# Patient Record
Sex: Female | Born: 1960 | Race: White | Hispanic: No | Marital: Married | Smoking: Never smoker
Health system: Southern US, Community
[De-identification: ages and names within clinical notes are randomized; demographics above are authoritative.]

## PROBLEM LIST (undated history)

## (undated) DIAGNOSIS — E063 Autoimmune thyroiditis: Secondary | ICD-10-CM

## (undated) DIAGNOSIS — D510 Vitamin B12 deficiency anemia due to intrinsic factor deficiency: Secondary | ICD-10-CM

## (undated) DIAGNOSIS — L719 Rosacea, unspecified: Secondary | ICD-10-CM

## (undated) DIAGNOSIS — N879 Dysplasia of cervix uteri, unspecified: Secondary | ICD-10-CM

## (undated) DIAGNOSIS — R87629 Unspecified abnormal cytological findings in specimens from vagina: Secondary | ICD-10-CM

## (undated) DIAGNOSIS — M4306 Spondylolysis, lumbar region: Secondary | ICD-10-CM

## (undated) DIAGNOSIS — N3010 Interstitial cystitis (chronic) without hematuria: Secondary | ICD-10-CM

## (undated) DIAGNOSIS — Q796 Ehlers-Danlos syndrome, unspecified: Secondary | ICD-10-CM

## (undated) HISTORY — DX: Dysplasia of cervix uteri, unspecified: N87.9

## (undated) HISTORY — PX: NASAL SINUS SURGERY: SHX719

## (undated) HISTORY — DX: Vitamin B12 deficiency anemia due to intrinsic factor deficiency: D51.0

## (undated) HISTORY — DX: Unspecified abnormal cytological findings in specimens from vagina: R87.629

## (undated) HISTORY — DX: Spondylolysis, lumbar region: M43.06

## (undated) HISTORY — DX: Interstitial cystitis (chronic) without hematuria: N30.10

## (undated) HISTORY — DX: Autoimmune thyroiditis: E06.3

## (undated) HISTORY — DX: Ehlers-Danlos syndrome, unspecified: Q79.60

## (undated) HISTORY — PX: OTHER SURGICAL HISTORY: SHX169

## (undated) HISTORY — DX: Rosacea, unspecified: L71.9

## (undated) HISTORY — PX: COLPOSCOPY: SHX161

## (undated) HISTORY — PX: CARDIAC CATHETERIZATION: SHX172

---

## 2008-03-20 HISTORY — PX: OTHER SURGICAL HISTORY: SHX169

## 2018-03-26 ENCOUNTER — Ambulatory Visit (INDEPENDENT_AMBULATORY_CARE_PROVIDER_SITE_OTHER): Payer: Self-pay | Admitting: Internal Medicine

## 2018-04-12 ENCOUNTER — Ambulatory Visit (INDEPENDENT_AMBULATORY_CARE_PROVIDER_SITE_OTHER): Payer: No Typology Code available for payment source | Admitting: Internal Medicine

## 2018-04-12 ENCOUNTER — Encounter (INDEPENDENT_AMBULATORY_CARE_PROVIDER_SITE_OTHER): Payer: Self-pay | Admitting: Internal Medicine

## 2018-04-12 VITALS — BP 110/74 | HR 73 | Temp 98.0°F | Ht 64.0 in | Wt 176.0 lb

## 2018-04-12 DIAGNOSIS — R195 Other fecal abnormalities: Secondary | ICD-10-CM | POA: Diagnosis not present

## 2018-04-12 NOTE — Progress Notes (Addendum)
   Subjective:    Patient ID: Anita Petersen, female    DOB: 10-16-1960, 58 y.o.   MRN: [id number]  HPI Referred by [facility] for positive FIT test. His last colonoscopy was 04/18/2013 per records by Dr. Aleene Davidson. Repeat in 5 years due to hx of polyps and Ehlers-Danlos Syndrome per records.  She has not seen any blood. No change in her stools.  Appetite is good. No weight loss. No abdominal pain.  No family hx of colon cancer.   Review of Systems       Past Medical History:  Diagnosis Date  . Cystitis, interstitial   . Ehlers-Danlos syndrome   . Hashimoto's thyroiditis   . Lumbar spondylolysis   . Pernicious anemia   . Pernicious anemia   . Rosacea     Past Surgical History:  Procedure Laterality Date  . bladder tack    . CARDIAC CATHETERIZATION    . cyst removed rt hand    . microvascular disease    . NASAL SINUS SURGERY    . pacemaker to bladder    . tonisillectomy      Allergies  Allergen Reactions  . Augmentin [Amoxicillin-Pot Clavulanate]     vomiting  . Codeine     Ill, vomiting.   . Vicodin [Hydrocodone-Acetaminophen]     Sweats, vomiting.    Current Outpatient Medications on File Prior to Visit  Medication Sig Dispense Refill  . Alpha-Lipoic Acid 600 MG CAPS Take by mouth.    . doxycycline (VIBRAMYCIN) 100 MG capsule Take 100 mg by mouth daily.     Marland Kitchen l-methylfolate-B6-B12 (METANX) 3-35-2 MG TABS tablet Take 1 tablet by mouth daily.    Marland Kitchen levothyroxine (SYNTHROID, LEVOTHROID) 125 MCG tablet Take 125 mcg by mouth daily before breakfast.    . pentosan polysulfate (ELMIRON) 100 MG capsule Take 200 mg by mouth 3 (three) times daily.     No current facility-administered medications on file prior to visit.         Objective:   Physical Exam Blood pressure 110/74, pulse 73, temperature 98 F (36.7 C), height 5\' 4"  (1.626 m), weight 176 lb (79.8 kg).  Alert and oriented. Skin warm and dry. Oral mucosa is moist.   . Sclera anicteric,  conjunctivae is pink. Thyroid not enlarged. No cervical lymphadenopathy. Lungs clear. Heart regular rate and rhythm.  Abdomen is soft. Bowel sounds are positive. No hepatomegaly. No abdominal masses felt. No tenderness.  No edema to lower extremities. Patient is alert and oriented.        Assessment & Plan:  Guaiac [facility]. Will get last colonoscopy from Dr. Vangie Bicker office. Further recommendations to follow.

## 2018-04-12 NOTE — Patient Instructions (Signed)
Will get records from Dr. Aleene Davidson.

## 2019-09-18 HISTORY — PX: GALLBLADDER SURGERY: SHX652

## 2019-12-04 ENCOUNTER — Other Ambulatory Visit (HOSPITAL_COMMUNITY): Payer: Self-pay | Admitting: Family Medicine

## 2019-12-04 DIAGNOSIS — Z1231 Encounter for screening mammogram for malignant neoplasm of breast: Secondary | ICD-10-CM

## 2019-12-19 ENCOUNTER — Other Ambulatory Visit: Payer: Self-pay

## 2019-12-19 ENCOUNTER — Ambulatory Visit (HOSPITAL_COMMUNITY): Payer: Non-veteran care

## 2019-12-19 ENCOUNTER — Ambulatory Visit (HOSPITAL_COMMUNITY)
Admission: RE | Admit: 2019-12-19 | Discharge: 2019-12-19 | Disposition: A | Discharge status: Home / Self Care | Payer: No Typology Code available for payment source | Source: Ambulatory Visit | Attending: Family Medicine | Admitting: Family Medicine

## 2019-12-19 DIAGNOSIS — Z1231 Encounter for screening mammogram for malignant neoplasm of breast: Secondary | ICD-10-CM

## 2020-12-20 ENCOUNTER — Other Ambulatory Visit (HOSPITAL_COMMUNITY): Payer: Self-pay | Admitting: Family Medicine

## 2020-12-20 ENCOUNTER — Other Ambulatory Visit (HOSPITAL_COMMUNITY): Payer: Self-pay | Admitting: *Deleted

## 2020-12-20 DIAGNOSIS — Z1231 Encounter for screening mammogram for malignant neoplasm of breast: Secondary | ICD-10-CM

## 2021-01-03 ENCOUNTER — Ambulatory Visit (HOSPITAL_COMMUNITY)
Admission: RE | Admit: 2021-01-03 | Discharge: 2021-01-03 | Disposition: A | Discharge status: Home / Self Care | Payer: No Typology Code available for payment source | Source: Ambulatory Visit | Attending: Family Medicine | Admitting: Family Medicine

## 2021-01-03 ENCOUNTER — Other Ambulatory Visit: Payer: Self-pay

## 2021-01-03 DIAGNOSIS — Z1231 Encounter for screening mammogram for malignant neoplasm of breast: Secondary | ICD-10-CM | POA: Diagnosis not present

## 2021-03-04 ENCOUNTER — Other Ambulatory Visit (HOSPITAL_COMMUNITY): Payer: Self-pay | Admitting: Family Medicine

## 2021-03-04 ENCOUNTER — Other Ambulatory Visit: Payer: Self-pay | Admitting: Family Medicine

## 2021-03-04 DIAGNOSIS — E039 Hypothyroidism, unspecified: Secondary | ICD-10-CM

## 2021-03-16 ENCOUNTER — Other Ambulatory Visit: Payer: Self-pay

## 2021-03-16 ENCOUNTER — Ambulatory Visit (HOSPITAL_COMMUNITY)
Admission: RE | Admit: 2021-03-16 | Discharge: 2021-03-16 | Disposition: A | Discharge status: Home / Self Care | Payer: No Typology Code available for payment source | Source: Ambulatory Visit | Attending: Family Medicine | Admitting: Family Medicine

## 2021-03-16 DIAGNOSIS — E039 Hypothyroidism, unspecified: Secondary | ICD-10-CM | POA: Insufficient documentation

## 2022-01-07 IMAGING — US US THYROID
1 series · 14 of 25 positions shown · non-contrast
Comparison: Reported comparison US Thyroid from [DATE], however
this record could not be found.

CLINICAL DATA: Hypothyroid.  Intermittent dysphagia.

EXAM:
THYROID ULTRASOUND
TECHNIQUE: Ultrasound examination of the thyroid gland and adjacent soft
tissues was performed.

[Series 1: us thyroid · 14 of 43 slices shown]
[im 1/43]
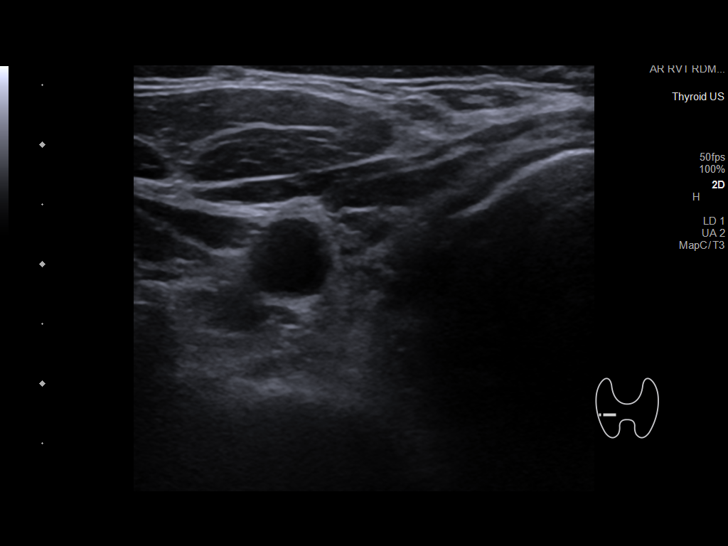
[im 4/43]
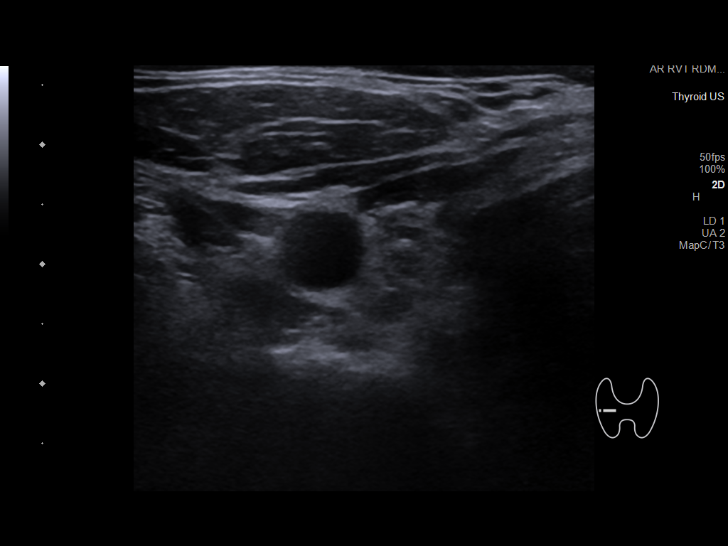
[im 8/43]
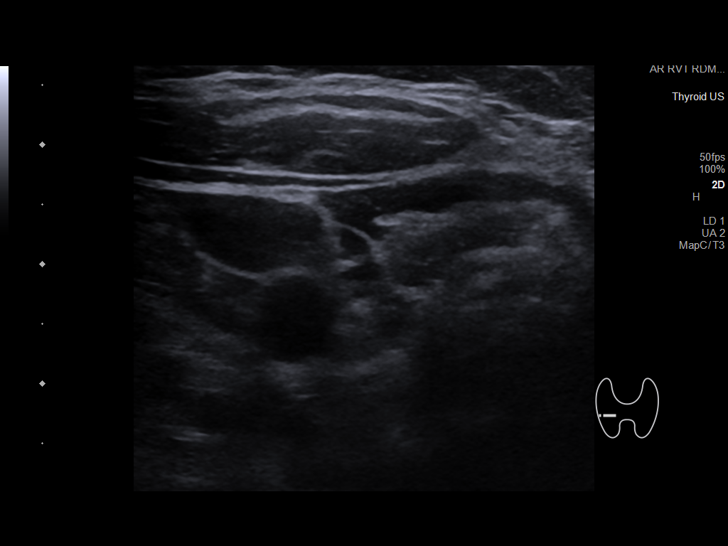
[im 11/43]
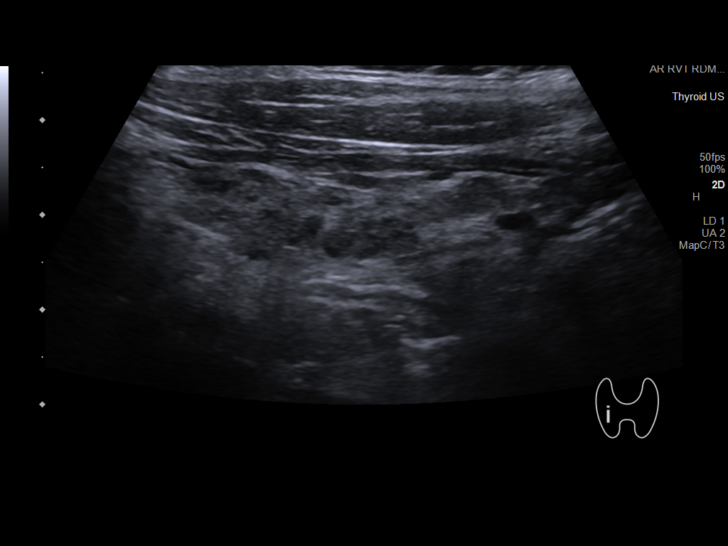
[im 15/43]
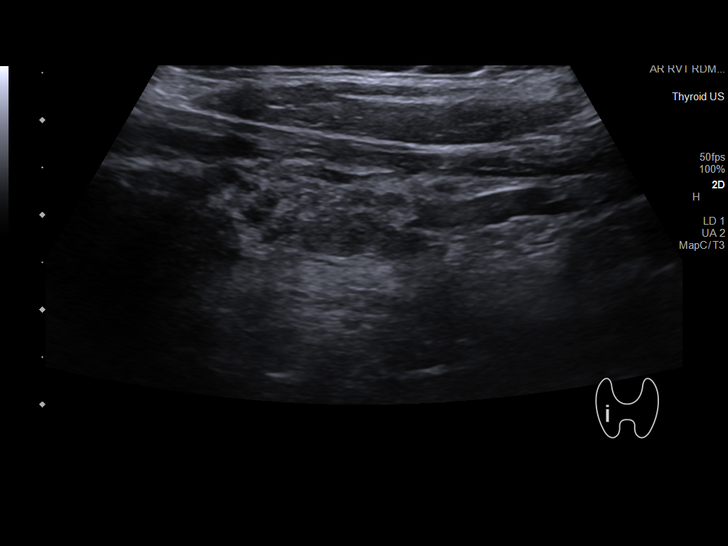
[im 16/43]
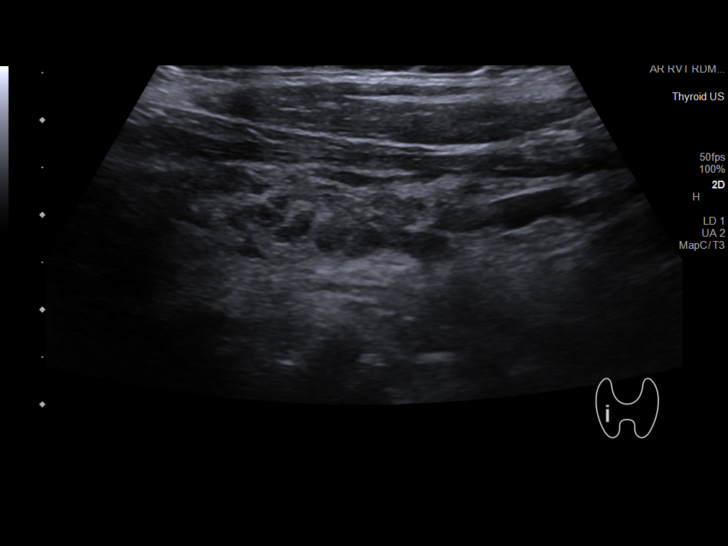
[im 20/43]
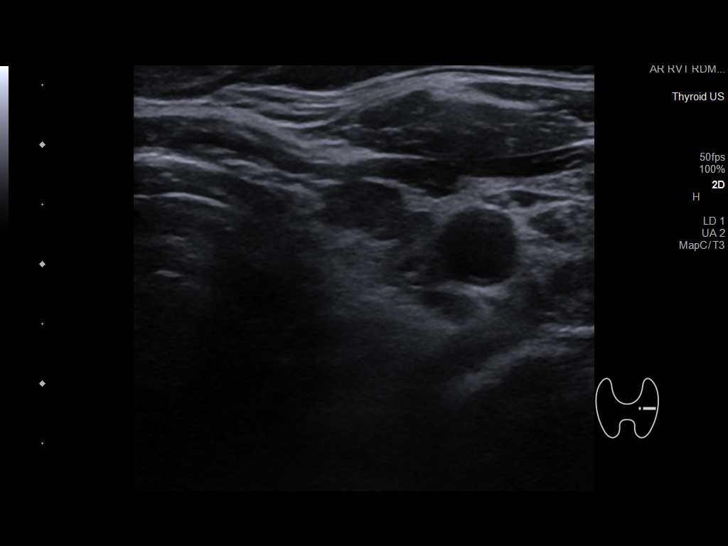
[im 23/43]
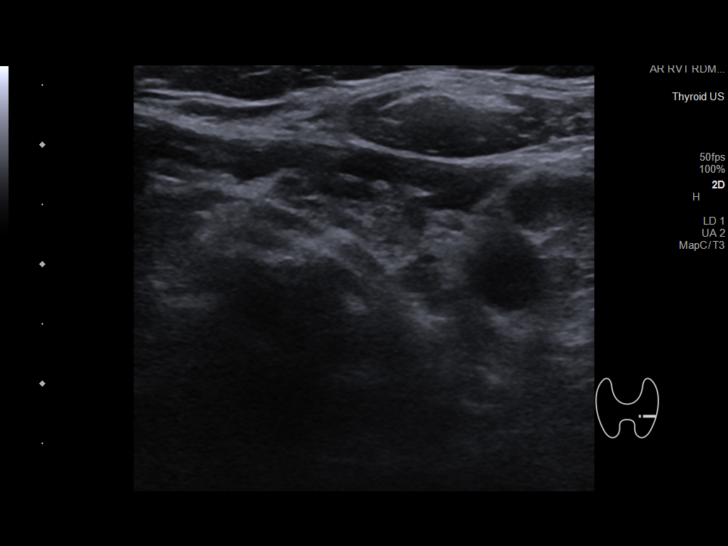
[im 27/43]
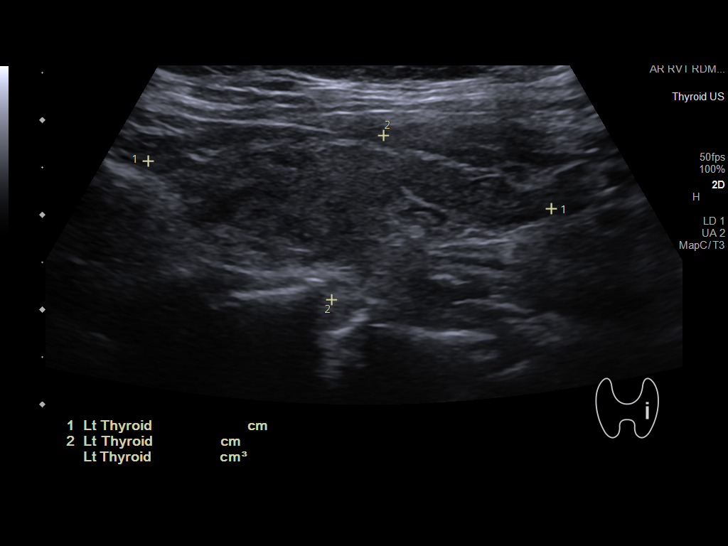
[im 29/43]
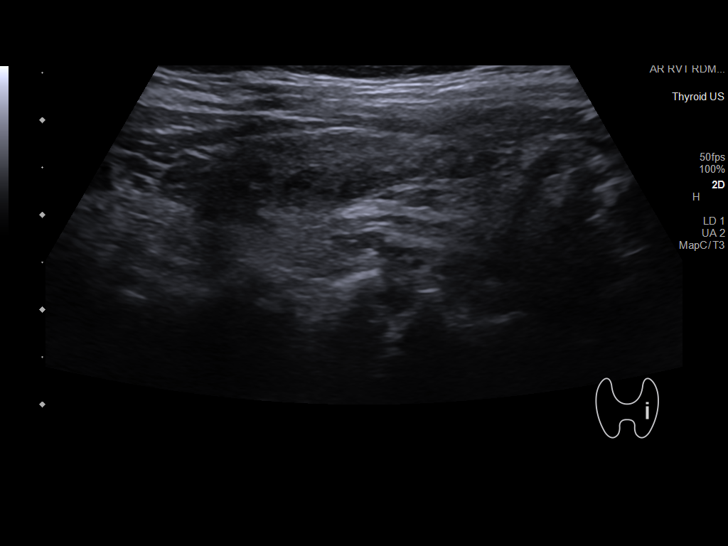
[im 32/43]
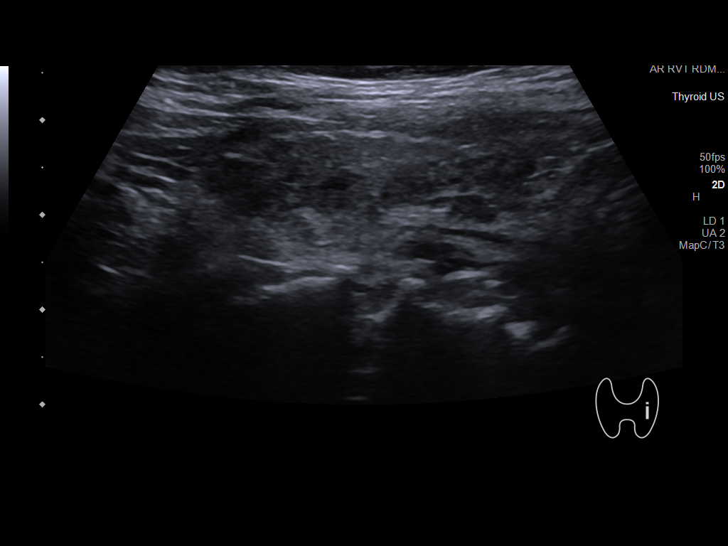
[im 36/43]
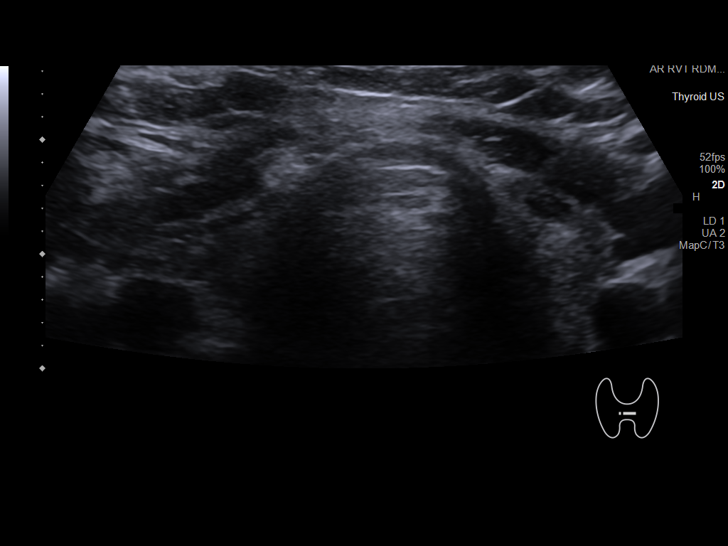
[im 39/43]
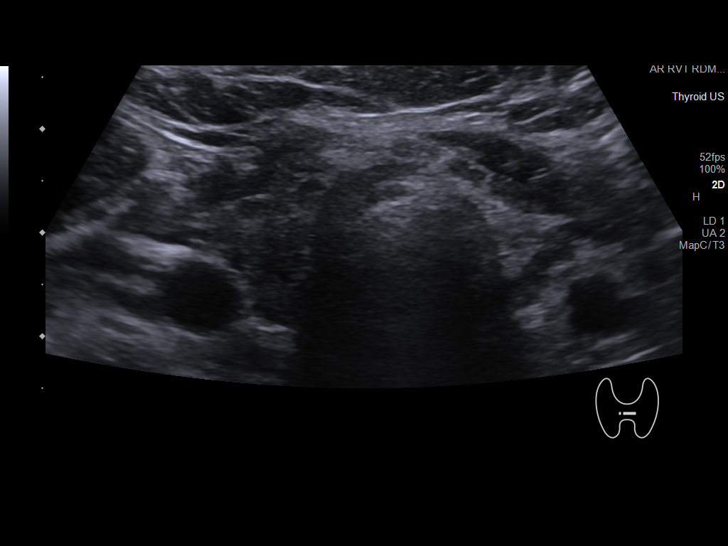
[im 43/43]
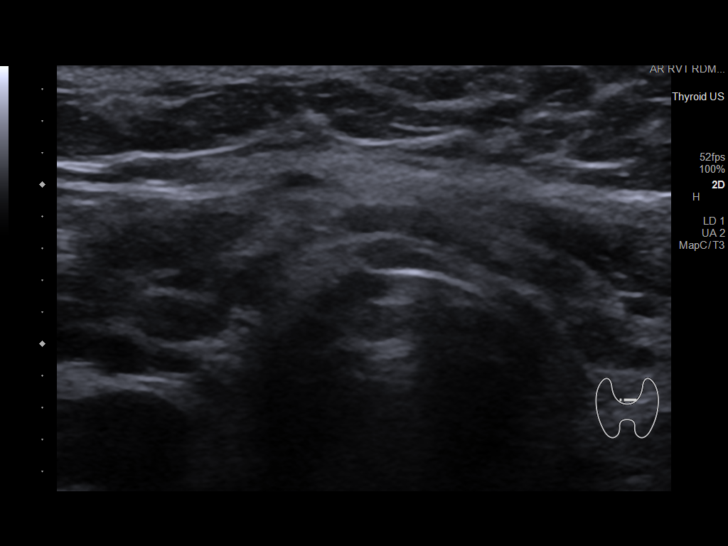

[14 of 25 positions shown; findings below may reference images not displayed]

FINDINGS: Parenchymal Echotexture: Moderately heterogenous

Isthmus: 0.2 cm

Right lobe: 3.9 x 1.5 x 1.0 cm

Left lobe: 4.3 x 1.8 x 0.8 cm

_________________________________________________________

Estimated total number of nodules >/= 1 cm: 0

Number of spongiform nodules >/=  2 cm not described below (TR1): 0

Number of mixed cystic and solid nodules >/= 1.5 cm not described
below (TR2): 0

_________________________________________________________

No discrete nodules are seen within the thyroid gland.

No cervical adenopathy or fluid collection within the imaged neck.
IMPRESSION: Atrophic / diminutive, heterogeneous thyroid gland without discrete
nodule.

## 2022-01-18 HISTORY — PX: OTHER SURGICAL HISTORY: SHX169

## 2022-02-17 HISTORY — PX: GANGLION CYST EXCISION: SHX1691

## 2022-03-16 ENCOUNTER — Encounter: Payer: Self-pay | Admitting: Internal Medicine

## 2022-03-22 ENCOUNTER — Other Ambulatory Visit (HOSPITAL_COMMUNITY): Payer: Self-pay | Admitting: Internal Medicine

## 2022-03-22 DIAGNOSIS — Z13820 Encounter for screening for osteoporosis: Secondary | ICD-10-CM

## 2022-03-22 DIAGNOSIS — Z1231 Encounter for screening mammogram for malignant neoplasm of breast: Secondary | ICD-10-CM

## 2022-03-30 ENCOUNTER — Other Ambulatory Visit: Payer: Non-veteran care

## 2022-03-30 ENCOUNTER — Ambulatory Visit (HOSPITAL_COMMUNITY)
Admission: RE | Admit: 2022-03-30 | Discharge: 2022-03-30 | Disposition: A | Discharge status: Home / Self Care | Payer: No Typology Code available for payment source | Source: Ambulatory Visit | Attending: Internal Medicine | Admitting: Internal Medicine

## 2022-03-30 DIAGNOSIS — Z13820 Encounter for screening for osteoporosis: Secondary | ICD-10-CM | POA: Insufficient documentation

## 2022-03-30 DIAGNOSIS — Z1231 Encounter for screening mammogram for malignant neoplasm of breast: Secondary | ICD-10-CM | POA: Diagnosis present

## 2022-05-19 HISTORY — PX: OTHER SURGICAL HISTORY: SHX169

## 2022-07-12 ENCOUNTER — Other Ambulatory Visit (HOSPITAL_COMMUNITY): Payer: Self-pay | Admitting: Nurse Practitioner

## 2022-07-12 DIAGNOSIS — Z1231 Encounter for screening mammogram for malignant neoplasm of breast: Secondary | ICD-10-CM

## 2022-10-04 ENCOUNTER — Other Ambulatory Visit (HOSPITAL_COMMUNITY): Payer: Self-pay | Admitting: Nurse Practitioner

## 2022-10-04 DIAGNOSIS — R7989 Other specified abnormal findings of blood chemistry: Secondary | ICD-10-CM

## 2022-10-17 ENCOUNTER — Ambulatory Visit (HOSPITAL_COMMUNITY)
Admission: RE | Admit: 2022-10-17 | Discharge: 2022-10-17 | Disposition: A | Discharge status: Home / Self Care | Payer: No Typology Code available for payment source | Source: Ambulatory Visit | Attending: Nurse Practitioner | Admitting: Nurse Practitioner

## 2022-10-17 DIAGNOSIS — R7989 Other specified abnormal findings of blood chemistry: Secondary | ICD-10-CM | POA: Insufficient documentation

## 2022-11-13 ENCOUNTER — Encounter: Payer: Self-pay | Admitting: Adult Health

## 2022-11-13 ENCOUNTER — Other Ambulatory Visit (HOSPITAL_COMMUNITY)
Admission: RE | Admit: 2022-11-13 | Discharge: 2022-11-13 | Disposition: A | Discharge status: Home / Self Care | Payer: No Typology Code available for payment source | Source: Ambulatory Visit | Attending: Adult Health | Admitting: Adult Health

## 2022-11-13 ENCOUNTER — Ambulatory Visit: Payer: No Typology Code available for payment source | Admitting: Adult Health

## 2022-11-13 VITALS — BP 131/73 | HR 60 | Ht 64.0 in | Wt 199.5 lb

## 2022-11-13 DIAGNOSIS — Z1211 Encounter for screening for malignant neoplasm of colon: Secondary | ICD-10-CM | POA: Diagnosis not present

## 2022-11-13 DIAGNOSIS — Z01419 Encounter for gynecological examination (general) (routine) without abnormal findings: Secondary | ICD-10-CM | POA: Insufficient documentation

## 2022-11-13 DIAGNOSIS — R309 Painful micturition, unspecified: Secondary | ICD-10-CM

## 2022-11-13 DIAGNOSIS — N3010 Interstitial cystitis (chronic) without hematuria: Secondary | ICD-10-CM | POA: Diagnosis not present

## 2022-11-13 DIAGNOSIS — R110 Nausea: Secondary | ICD-10-CM

## 2022-11-13 DIAGNOSIS — Q796 Ehlers-Danlos syndrome, unspecified: Secondary | ICD-10-CM

## 2022-11-13 DIAGNOSIS — L739 Follicular disorder, unspecified: Secondary | ICD-10-CM

## 2022-11-13 LAB — POCT URINALYSIS DIPSTICK
Blood, UA: NEGATIVE
Glucose, UA: NEGATIVE
Ketones, UA: NEGATIVE
Leukocytes, UA: NEGATIVE
Nitrite, UA: NEGATIVE
Protein, UA: NEGATIVE

## 2022-11-13 LAB — HEMOCCULT GUIAC POC 1CARD (OFFICE): Fecal Occult Blood, POC: NEGATIVE

## 2022-11-13 MED ORDER — PROMETHAZINE HCL 25 MG PO TABS: 25.0000 mg | ORAL_TABLET | Freq: Four times a day (QID) | ORAL | 1 refills | Status: AC | PRN

## 2022-11-13 MED ORDER — SILVER SULFADIAZINE 1 % EX CREA: 1.0000 | TOPICAL_CREAM | Freq: Two times a day (BID) | CUTANEOUS | 0 refills | Status: AC

## 2022-11-13 NOTE — Progress Notes (Signed)
Patient ID: Anita Petersen, female   DOB: June 27, 1960, 62 y.o.   MRN: [id number] History of Present Illness: Anita Petersen is a 62 year old white female, married, PM in for a well woman gyn exam and pap. She is a new pt. [facility]( [facility] ([facility] She has Hashimoto's and just had thyroid meds increased, last TSH was 16. She is having burning with urination and nausea for 2 days, she has IC, and has bladder stimulator.  Has bumps in peri area. She did say she had cervical dysplasia at 17 and had 2 colpos.  She has EDS and having right shoulder surgery 12/13/22 in [place] with Dr Sanjuana Letters for rotator cuff.   PCP is Grayland Jack NP   Current Medications, Allergies, Past Medical History, Past Surgical History, Family History and Social History were reviewed in [employer] record.     Review of Systems: Patient denies any headaches, hearing loss, fatigue, blurred vision, shortness of breath, chest pain, abdominal pain, problems with bowel movements,or intercourse. No joint pain or mood swings.  See HPI for positives   Physical Exam:BP 131/73 (BP Location: Left Arm, Patient Position: Sitting, Cuff Size: Normal)   Pulse 60   Ht 5\' 4"  (1.626 m)   Wt 199 lb 8 oz (90.5 kg)   BMI 34.24 kg/m   urine dipstick was negative  General:  Well developed, well nourished, no acute distress Skin:  Warm and dry Neck:  Midline trachea, normal thyroid, good ROM, no lymphadenopathy,no carotid bruits heard  Lungs; Clear to auscultation bilaterally Breast:  No dominant palpable mass, retraction, or nipple discharge Cardiovascular: Regular rate and rhythm Abdomen:  Soft, non tender, no hepatosplenomegaly Pelvic:  External genitalia is normal in appearance, has folliculitis.  The vagina is pale. Urethra has no lesions or masses. The cervix is stenotic, pap with HR HPV genotyping performed.  Uterus is felt to be normal size, shape, and contour.  No adnexal masses or tenderness  noted.Bladder is non tender, no masses felt. Rectal: Good sphincter tone, no polyps, or hemorrhoids felt.  Hemoccult negative.+mild rectocele Extremities/musculoskeletal:  No swelling or varicosities noted, no clubbing or cyanosis Psych:  No mood changes, alert and cooperative,seems happy AA is 0 Fall risk is low    11/13/2022    9:28 AM  Depression screen PHQ 2/9  Decreased Interest 0  Down, Depressed, Hopeless 0  PHQ - 2 Score 0  Altered sleeping 2  Tired, decreased energy 3  Change in appetite 3  Feeling bad or failure about yourself  0  Trouble concentrating 0  Moving slowly or fidgety/restless 0  Suicidal thoughts 0  PHQ-9 Score 8   Has atarax     11/13/2022    9:29 AM  GAD 7 : Generalized Anxiety Score  Nervous, Anxious, on Edge 1  Control/stop worrying 0  Worry too much - different things 0  Trouble relaxing 0  Restless 0  Easily annoyed or irritable 1  Afraid - awful might happen 0  Total GAD 7 Score 2      Upstream - 11/13/22 0958       Pregnancy Intention Screening   Does the patient want to become pregnant in the next year? N/A    Does the patient's partner want to become pregnant in the next year? N/A    Would the patient like to discuss contraceptive options today? N/A      Contraception Wrap Up   Current Method No Method - Other Reason  postmenopausal   Reason for No Current Contraceptive Method at Intake ([facility] Only) Other    End Method No Method - Other Reason   postmenopausal   Contraception Counseling Provided No            Examination chaperoned by Malachy Mood LPN  Impression and Plan: 1. Encounter for gynecological examination with Papanicolaou smear of cervix Pap sent Pap in 3 years if normal  - Cytology - PAP( [place]) [facility] with [facility] Had cologuard that was negative this year Mammogram was negative 03/30/22  2. Pain with urination Urine was negative  - POCT Urinalysis Dipstick  3. Encounter for screening fecal occult blood  testing Hemoccult was negative   4. IC (interstitial cystitis) Sees Dr Marcelyn Bruins at [facility]  5. EDS (Ehlers-Danlos syndrome)   6. Nausea Will rx phenergan  7. Folliculitis Has several pimple like areas Do not shave Use bar soaps Will rx silvadene  Meds ordered this encounter  Medications   promethazine (PHENERGAN) 25 MG tablet    Sig: Take 1 tablet (25 mg total) by mouth every 6 (six) hours as needed for nausea or vomiting.    Dispense:  30 tablet    Refill:  1    Order Specific Question:   Supervising Provider    Answer:   Duane Lope H [[id number]]   silver sulfADIAZINE (SILVADENE) 1 % cream    Sig: Apply 1 Application topically 2 (two) times daily.    Dispense:  50 g    Refill:  0    Order Specific Question:   Supervising Provider    Answer:   Duane Lope H [[id number]]

## 2022-11-15 LAB — CYTOLOGY - PAP
Adequacy: ABSENT
Comment: NEGATIVE
Diagnosis: NEGATIVE
High risk HPV: NEGATIVE

## 2022-12-29 ENCOUNTER — Other Ambulatory Visit: Payer: Self-pay | Admitting: Adult Health

## 2022-12-29 MED ORDER — SULFAMETHOXAZOLE-TRIMETHOPRIM 800-160 MG PO TABS: 1.0000 | ORAL_TABLET | Freq: Two times a day (BID) | ORAL | 0 refills | Status: DC

## 2022-12-29 NOTE — Progress Notes (Signed)
Rx septra ds 

## 2023-01-10 HISTORY — PX: ROTATOR CUFF REPAIR: SHX139

## 2023-04-02 ENCOUNTER — Ambulatory Visit (HOSPITAL_COMMUNITY)
Admission: RE | Admit: 2023-04-02 | Discharge: 2023-04-02 | Disposition: A | Discharge status: Home / Self Care | Payer: No Typology Code available for payment source | Source: Ambulatory Visit | Attending: Nurse Practitioner | Admitting: Nurse Practitioner

## 2023-04-02 DIAGNOSIS — Z1231 Encounter for screening mammogram for malignant neoplasm of breast: Secondary | ICD-10-CM | POA: Insufficient documentation

## 2023-08-15 ENCOUNTER — Other Ambulatory Visit: Payer: Self-pay | Admitting: Adult Health

## 2023-08-15 MED ORDER — SULFAMETHOXAZOLE-TRIMETHOPRIM 800-160 MG PO TABS: 1.0000 | ORAL_TABLET | Freq: Two times a day (BID) | ORAL | 0 refills | Status: DC

## 2023-08-15 NOTE — Progress Notes (Signed)
 Rx septra ds

## 2023-11-05 ENCOUNTER — Ambulatory Visit: Admitting: Adult Health

## 2023-12-21 ENCOUNTER — Other Ambulatory Visit: Payer: Self-pay | Admitting: Adult Health

## 2023-12-21 ENCOUNTER — Telehealth: Payer: Self-pay | Admitting: Adult Health

## 2023-12-21 NOTE — Telephone Encounter (Signed)
 She is using [facility] [address] [place]  [phone], will rx doxycycline 100 mg 1 bid x 10 days, I called it in.

## 2024-01-04 ENCOUNTER — Ambulatory Visit: Admitting: Adult Health

## 2024-01-22 ENCOUNTER — Ambulatory Visit (INDEPENDENT_AMBULATORY_CARE_PROVIDER_SITE_OTHER): Admitting: Adult Health

## 2024-01-22 ENCOUNTER — Encounter: Payer: Self-pay | Admitting: Adult Health

## 2024-01-22 ENCOUNTER — Other Ambulatory Visit (HOSPITAL_COMMUNITY)
Admission: RE | Admit: 2024-01-22 | Discharge: 2024-01-22 | Disposition: A | Discharge status: Home / Self Care | Source: Ambulatory Visit | Attending: Adult Health | Admitting: Adult Health

## 2024-01-22 VITALS — BP 124/71 | HR 71 | Ht 64.0 in | Wt 217.0 lb

## 2024-01-22 DIAGNOSIS — H9202 Otalgia, left ear: Secondary | ICD-10-CM

## 2024-01-22 DIAGNOSIS — Z01419 Encounter for gynecological examination (general) (routine) without abnormal findings: Secondary | ICD-10-CM

## 2024-01-22 DIAGNOSIS — Z124 Encounter for screening for malignant neoplasm of cervix: Secondary | ICD-10-CM | POA: Insufficient documentation

## 2024-01-22 DIAGNOSIS — Z1331 Encounter for screening for depression: Secondary | ICD-10-CM

## 2024-01-22 DIAGNOSIS — Q796 Ehlers-Danlos syndrome, unspecified: Secondary | ICD-10-CM | POA: Diagnosis not present

## 2024-01-22 DIAGNOSIS — Z1151 Encounter for screening for human papillomavirus (HPV): Secondary | ICD-10-CM

## 2024-01-22 MED ORDER — AZITHROMYCIN 250 MG PO TABS: ORAL_TABLET | ORAL | 0 refills | Status: AC

## 2024-01-22 NOTE — Progress Notes (Signed)
 Patient ID: Anita Petersen, female   DOB: October 01, 1960, 63 y.o.   MRN: [id number] History of Present Illness: Anita Petersen is a 63 year old white female, married, PM in for a well woman gyn exam and requests a pap. She has pain in left ear for about 2 weeks, can feel pulse. She has been stressed too. Her husband has penal cancer and bladder cancer and +HIV.  PCP is Anita Bunker NP  Current Medications, Allergies, Past Medical History, Past Surgical History, Family History and Social History were reviewed in [employer] record.     Review of Systems: Patient denies any  hearing loss, fatigue, blurred vision, shortness of breath, chest pain, abdominal pain, problems with bowel movements(has rectocele she says), urination(has IC), or intercourse(not active). No joint pain or mood swings.  Has occasional headaches,  has pain in left ear for about 2 weeks, no fever or drainage +stress, has gained weight, ate junk when husband in hospital  Ribs hurt has EDS   Physical Exam:BP 124/71 (BP Location: Left Arm, Patient Position: Sitting, Cuff Size: Large)   Pulse 71   Ht 5' 4 (1.626 m)   Wt 217 lb (98.4 kg)   BMI 37.25 kg/m   General:  Well developed, well nourished, no acute distress Skin:  Warm and dry Neck:  Midline trachea, normal thyroid , good ROM, no lymphadenopathy,no carotid bruits heard,  right ear has pearly gray TM. Left year has +fluid behind membrane Lungs; Clear to auscultation bilaterally Breast:  No dominant palpable mass, retraction, or nipple discharge, ribs are tender bilaterally  Cardiovascular: Regular rate and rhythm Abdomen:  Soft, non tender, no hepatosplenomegaly Pelvic:  External genitalia is normal in appearance, no lesions.  The vagina is pale. Urethra has no lesions or masses. The cervix is smooth, pap with HR HPV genotyping performed.  Uterus is felt to be normal size, shape, and contour.  No adnexal masses or tenderness noted.Bladder is non tender, no  masses felt. Rectal: Deferred  Extremities/musculoskeletal:  No swelling or varicosities noted, no clubbing or cyanosis Psych:  No mood changes, alert and cooperative,seems happy AA is 0 Fall risk is low    01/22/2024    1:29 PM 11/13/2022    9:28 AM  Depression screen PHQ 2/9  Decreased Interest 0 0  Down, Depressed, Hopeless 0 0  PHQ - 2 Score 0 0  Altered sleeping 0 2  Tired, decreased energy 3 3  Change in appetite 3 3  Feeling bad or failure about yourself  0 0  Trouble concentrating 1 0  Moving slowly or fidgety/restless 0 0  Suicidal thoughts 0 0  PHQ-9 Score 7 8   Has atarax     01/22/2024    1:30 PM 11/13/2022    9:29 AM  GAD 7 : Generalized Anxiety Score  Nervous, Anxious, on Edge 2 1  Control/stop worrying 0 0  Worry too much - different things 0 0  Trouble relaxing 2 0  Restless 1 0  Easily annoyed or irritable 2 1  Afraid - awful might happen 0 0  Total GAD 7 Score 7 2    Upstream - 01/22/24 1341       Pregnancy Intention Screening   Does the patient want to become pregnant in the next year? N/A    Does the patient's partner want to become pregnant in the next year? N/A    Would the patient like to discuss contraceptive options today? N/A      Contraception  Wrap Up   Current Method Post-Menopause    End Method Post-Menopause    Contraception Counseling Provided No           Examination chaperoned by Clarita Salt LPN  Impression and plan: 1. Routine Papanicolaou smear Pap sent  - Cytology - PAP( [place])  2. Left ear pain, +fluid Will rx Z pack Meds ordered this encounter  Medications   azithromycin (ZITHROMAX) 250 MG tablet    Sig: Take 2 now and 1 daily for 4 days    Dispense:  6 tablet    Refill:  0    Supervising Provider:   JAYNE MINDER H [[id number]]     3. EDS (Ehlers-Danlos syndrome)  4. Encounter for well woman exam with routine gynecological exam (Primary) Pap sent Physical in 1 year   Labs with PCP  She needs to get  mammogram and bone density through [place]

## 2024-01-28 ENCOUNTER — Ambulatory Visit: Payer: Self-pay | Admitting: Adult Health

## 2024-01-28 LAB — CYTOLOGY - PAP
Adequacy: ABSENT
Comment: NEGATIVE
Diagnosis: NEGATIVE
High risk HPV: NEGATIVE

## 2024-06-23 ENCOUNTER — Ambulatory Visit: Payer: Self-pay | Admitting: Nurse Practitioner
